# Patient Record
Sex: Female | Born: 1990 | Race: White | Hispanic: No | Marital: Single | Smoking: Current some day smoker
Health system: Southern US, Community
[De-identification: ages and names within clinical notes are randomized; demographics above are authoritative.]

## PROBLEM LIST (undated history)

## (undated) DIAGNOSIS — F4321 Adjustment disorder with depressed mood: Secondary | ICD-10-CM

## (undated) DIAGNOSIS — G4700 Insomnia, unspecified: Secondary | ICD-10-CM

## (undated) HISTORY — PX: APPENDECTOMY: SHX54

---
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
## 2014-11-02 ENCOUNTER — Encounter (HOSPITAL_BASED_OUTPATIENT_CLINIC_OR_DEPARTMENT_OTHER): Payer: Self-pay

## 2014-11-02 ENCOUNTER — Emergency Department (HOSPITAL_BASED_OUTPATIENT_CLINIC_OR_DEPARTMENT_OTHER): Payer: No Typology Code available for payment source

## 2014-11-02 ENCOUNTER — Emergency Department (HOSPITAL_BASED_OUTPATIENT_CLINIC_OR_DEPARTMENT_OTHER)
Admission: EM | Admit: 2014-11-02 | Discharge: 2014-11-02 | Disposition: A | Discharge status: Home / Self Care | Payer: No Typology Code available for payment source | Attending: Emergency Medicine | Admitting: Emergency Medicine

## 2014-11-02 DIAGNOSIS — Y92410 Unspecified street and highway as the place of occurrence of the external cause: Secondary | ICD-10-CM | POA: Diagnosis not present

## 2014-11-02 DIAGNOSIS — S161XXA Strain of muscle, fascia and tendon at neck level, initial encounter: Secondary | ICD-10-CM | POA: Insufficient documentation

## 2014-11-02 DIAGNOSIS — Y9389 Activity, other specified: Secondary | ICD-10-CM | POA: Insufficient documentation

## 2014-11-02 DIAGNOSIS — G4700 Insomnia, unspecified: Secondary | ICD-10-CM | POA: Insufficient documentation

## 2014-11-02 DIAGNOSIS — Z720 Tobacco use: Secondary | ICD-10-CM | POA: Diagnosis not present

## 2014-11-02 DIAGNOSIS — S20212A Contusion of left front wall of thorax, initial encounter: Secondary | ICD-10-CM | POA: Diagnosis not present

## 2014-11-02 DIAGNOSIS — Y998 Other external cause status: Secondary | ICD-10-CM | POA: Insufficient documentation

## 2014-11-02 DIAGNOSIS — F329 Major depressive disorder, single episode, unspecified: Secondary | ICD-10-CM | POA: Insufficient documentation

## 2014-11-02 DIAGNOSIS — Z79899 Other long term (current) drug therapy: Secondary | ICD-10-CM | POA: Diagnosis not present

## 2014-11-02 DIAGNOSIS — S4992XA Unspecified injury of left shoulder and upper arm, initial encounter: Secondary | ICD-10-CM | POA: Insufficient documentation

## 2014-11-02 DIAGNOSIS — S199XXA Unspecified injury of neck, initial encounter: Secondary | ICD-10-CM | POA: Diagnosis present

## 2014-11-02 HISTORY — DX: Insomnia, unspecified: G47.00

## 2014-11-02 HISTORY — DX: Adjustment disorder with depressed mood: F43.21

## 2014-11-02 MED ORDER — IBUPROFEN 600 MG PO TABS: 600.0000 mg | ORAL_TABLET | Freq: Four times a day (QID) | ORAL | Status: AC | PRN

## 2014-11-02 MED ORDER — KETOROLAC TROMETHAMINE 60 MG/2ML IM SOLN
60.0000 mg | Freq: Once | INTRAMUSCULAR | Status: AC
  Administered 2014-11-02: 60 mg via INTRAMUSCULAR
  Filled 2014-11-02: qty 2

## 2014-11-02 MED ORDER — CYCLOBENZAPRINE HCL 10 MG PO TABS
10.0000 mg | ORAL_TABLET | Freq: Once | ORAL | Status: AC
  Administered 2014-11-02: 10 mg via ORAL
  Filled 2014-11-02: qty 1

## 2014-11-02 MED ORDER — CYCLOBENZAPRINE HCL 10 MG PO TABS: 10.0000 mg | ORAL_TABLET | Freq: Three times a day (TID) | ORAL | Status: AC | PRN
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
## 2014-11-02 NOTE — ED Notes (Signed)
Patient transported to X-ray 

## 2014-11-02 NOTE — ED Notes (Signed)
Patient preparing for discharge. 
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
